# Patient Record
Sex: Female | Born: 1975
Health system: Southern US, Community
[De-identification: ages and names within clinical notes are randomized; demographics above are authoritative.]

## PROBLEM LIST (undated history)

## (undated) DIAGNOSIS — F329 Major depressive disorder, single episode, unspecified: Secondary | ICD-10-CM

## (undated) DIAGNOSIS — F32A Depression, unspecified: Secondary | ICD-10-CM

---

## 2003-11-17 ENCOUNTER — Emergency Department: Payer: Self-pay | Admitting: Emergency Medicine

## 2006-03-17 ENCOUNTER — Ambulatory Visit: Payer: Self-pay | Admitting: Family Medicine

## 2006-12-12 ENCOUNTER — Ambulatory Visit: Payer: Self-pay | Admitting: Obstetrics and Gynecology

## 2007-01-26 ENCOUNTER — Ambulatory Visit: Payer: Self-pay | Admitting: Obstetrics and Gynecology

## 2007-04-03 ENCOUNTER — Ambulatory Visit: Payer: Self-pay | Admitting: Obstetrics and Gynecology

## 2007-06-02 ENCOUNTER — Inpatient Hospital Stay: Payer: Self-pay | Admitting: Obstetrics and Gynecology

## 2008-10-25 ENCOUNTER — Emergency Department: Payer: Self-pay | Admitting: Emergency Medicine

## 2009-07-16 IMAGING — US US OB US >=[ID] SNGL FETUS
1 series · 17 of 28 positions shown · non-contrast
Comparison: none

REASON FOR EXAM: LLP, EFW, AFI, anatomy
COMMENTS:

[Series 1: us ob us >=(id) sngl fetus · 17 of 80 slices shown]
[im 1/80]
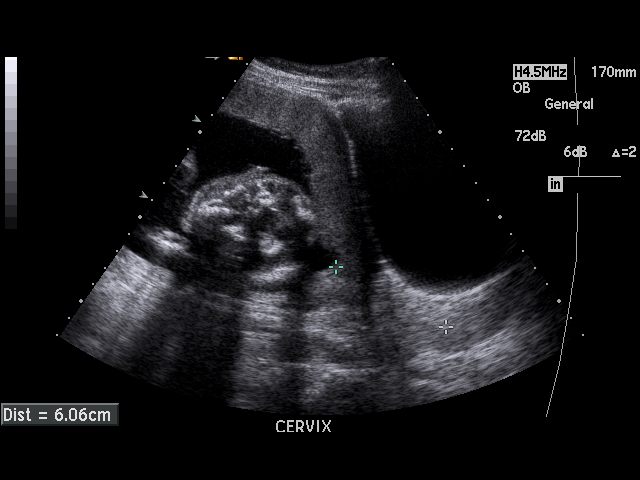
[im 6/80]
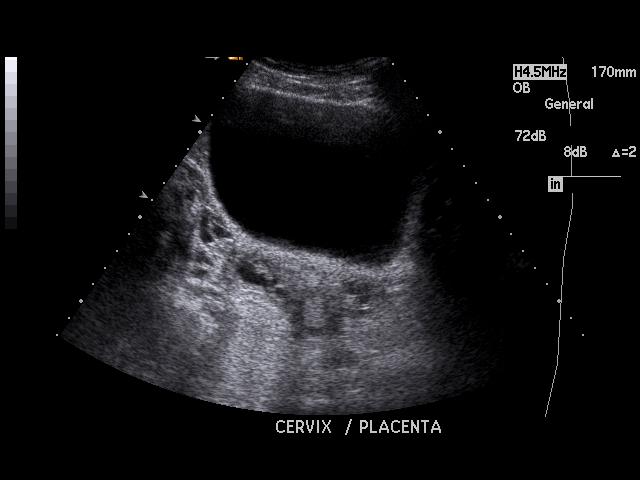
[im 12/80]
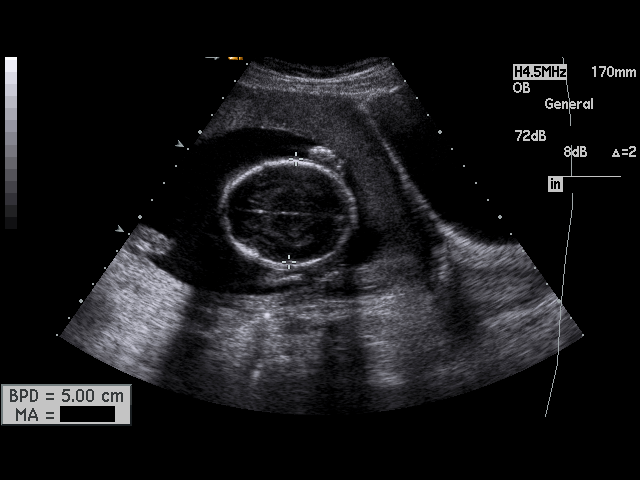
[im 15/80]
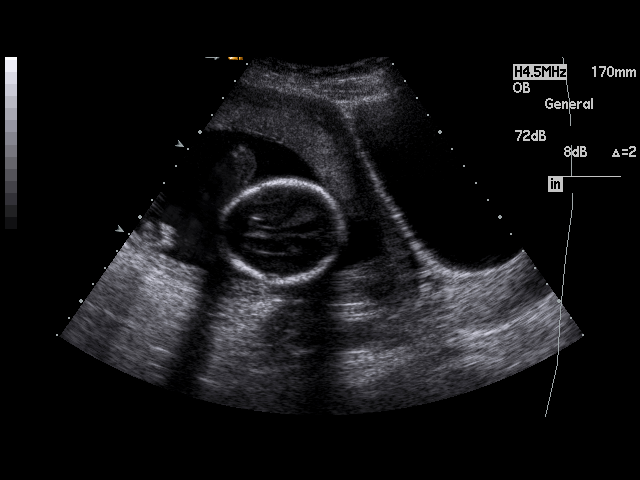
[im 21/80]
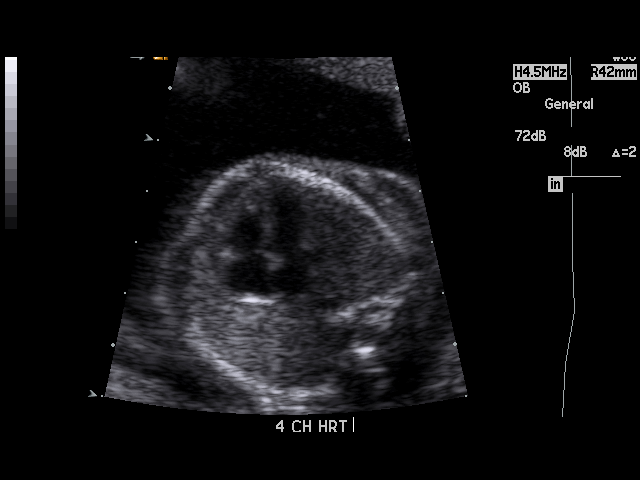
[im 27/80]
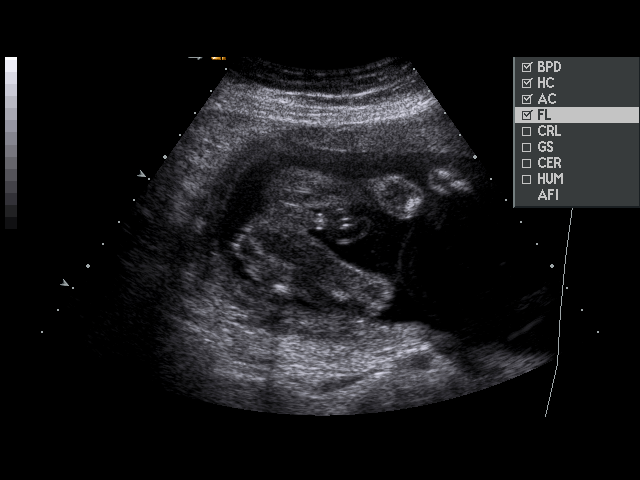
[im 30/80]
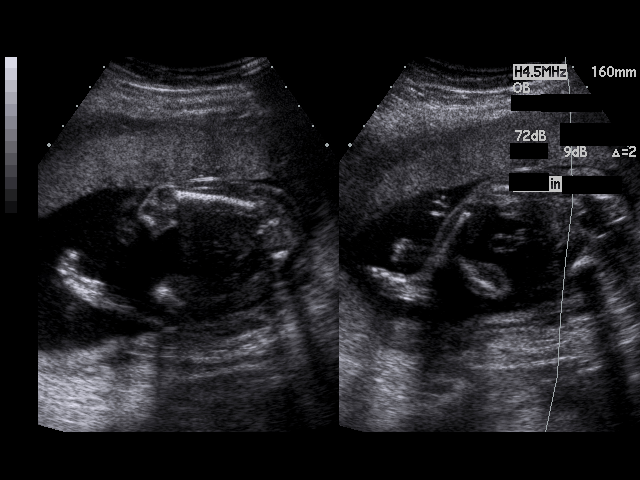
[im 36/80]
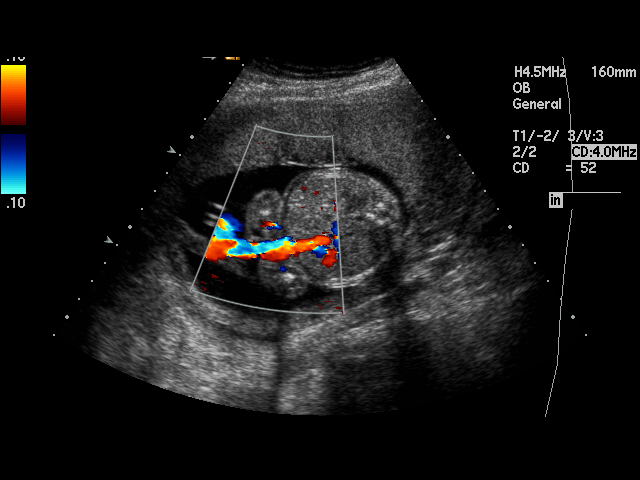
[im 41/80]
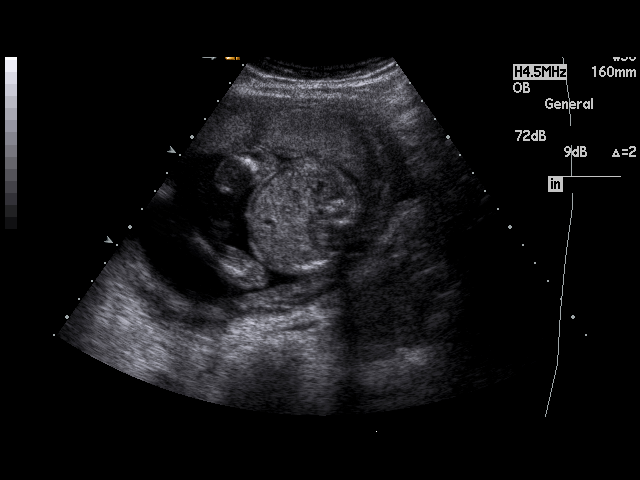
[im 44/80]
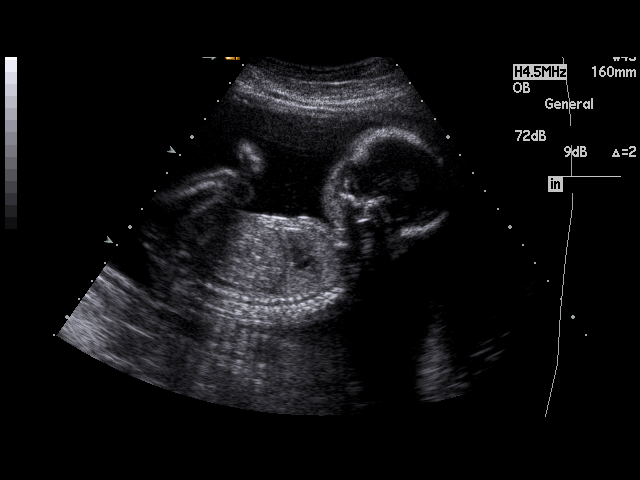
[im 50/80]
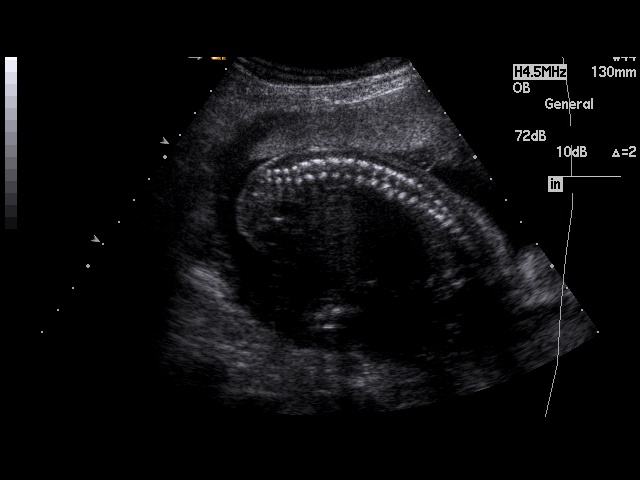
[im 53/80]
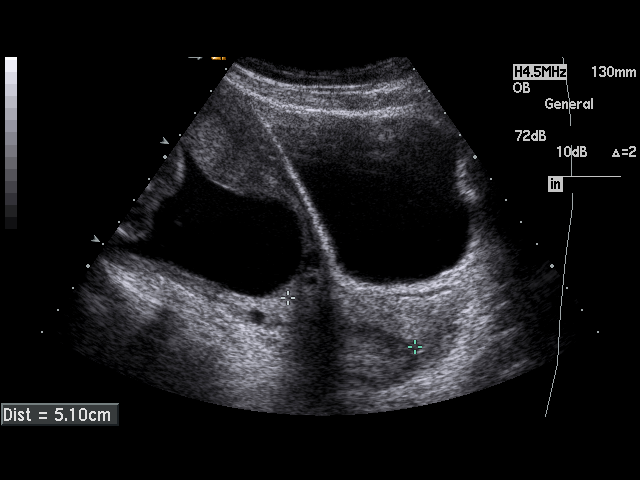
[im 59/80]
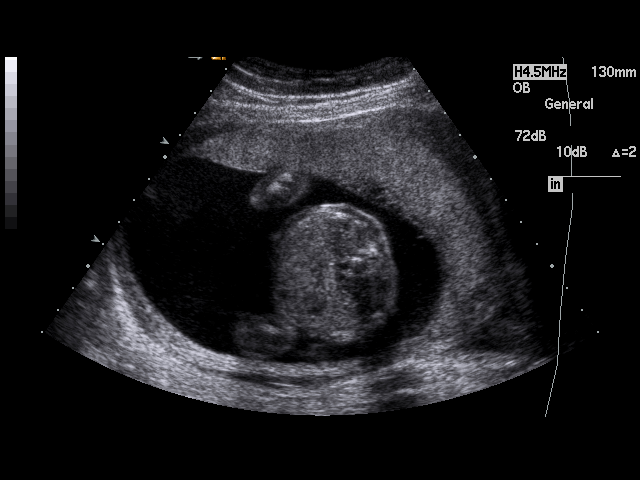
[im 65/80]
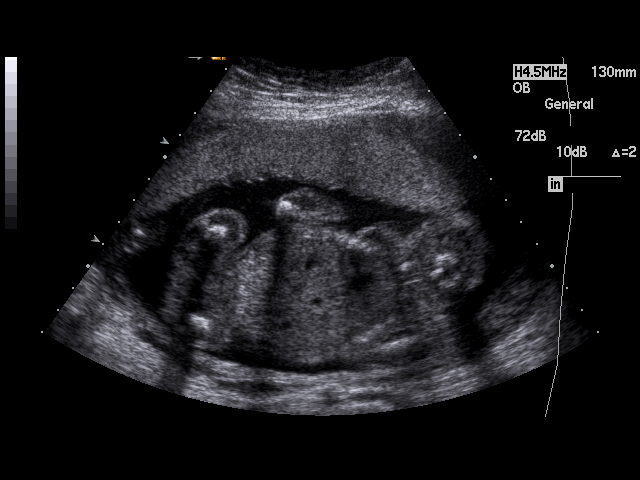
[im 68/80]
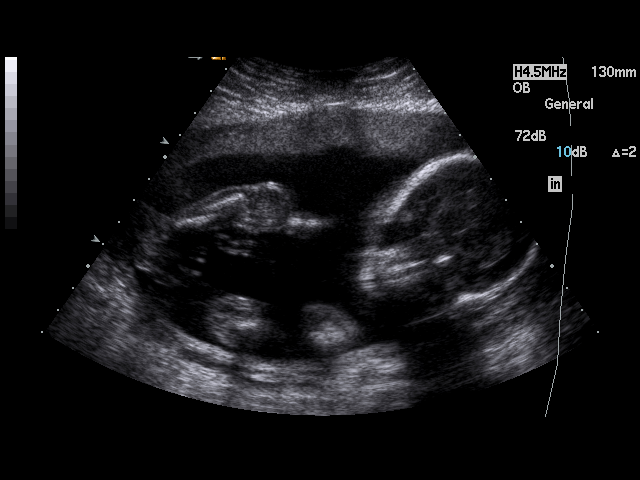
[im 74/80]
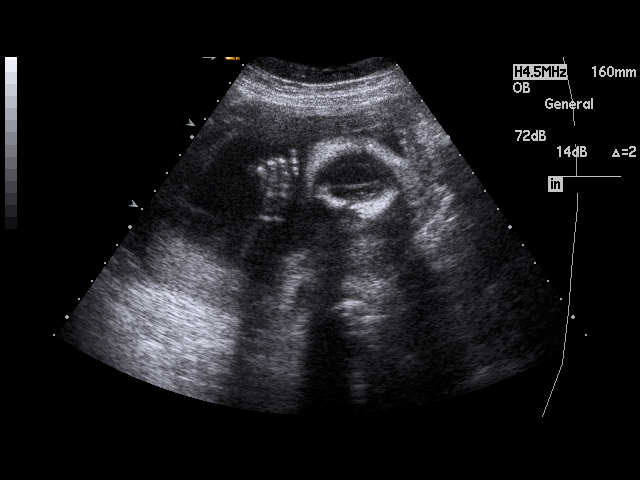
[im 80/80]
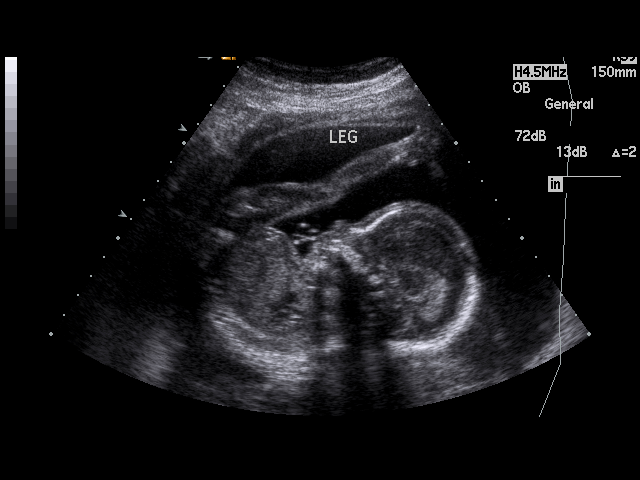

[17 of 28 positions shown; findings below may reference images not displayed]

PROCEDURE:     US  - US OB GREATER/OR EQUAL TO OVDPQ  - January 26, 2007 [DATE]

RESULT:     There is a viable IUP with a variable presentation. The placenta
is anterior and partially low lying. The amniotic fluid volume is estimated
to be normal. Fetal cardiac activity with a rate of 144 beats per minute was
demonstrated. The intracranial structures, craniocervical junction and the
spinal structures are normal in appearance. A four-chambered heart is
demonstrated. The fetal stomach, kidneys and urinary bladder are normal in
appearance. The amniotic fluid index is 14.37 cm which is normal.

Measured parameters:

     BPD:  5.0 cm, corresponding to EGA of 21 weeks, 1 day
     HC:   18.9 cm, corresponding to EGA of 87weeks, 2 days
     AC:   16.2 cm, corresponding to EGA of 21 weeks, 2 days
     FL:      3.7 cm, corresponding to EGA of 21 weeks, 5 days

The estimated fetal weight is 446 grams plus or minus 60 grams.
IMPRESSION: There is a viable IUP with estimated gestational age of 21
weeks, 2 days plus or minus approximately 12 days. The estimated date of
confinement is 06/06/2007. This is in reasonable agreement with the
patient's clinical dating. No fetal anomalies are identified. The amniotic
fluid index is normal. The estimated fetal weight is 446 grams plus or minus
60 grams.

## 2009-08-31 ENCOUNTER — Ambulatory Visit: Payer: Self-pay | Admitting: Family Medicine

## 2010-02-11 ENCOUNTER — Ambulatory Visit: Payer: Self-pay | Admitting: Family Medicine

## 2011-01-12 HISTORY — PX: TUBAL LIGATION: SHX77

## 2011-10-18 ENCOUNTER — Ambulatory Visit: Payer: Self-pay

## 2011-11-11 ENCOUNTER — Inpatient Hospital Stay: Payer: Self-pay | Admitting: Obstetrics & Gynecology

## 2011-11-11 LAB — PIH PROFILE
Anion Gap: 14 (ref 7–16)
BUN: 6 mg/dL — ABNORMAL LOW (ref 7–18)
Calcium, Total: 9.3 mg/dL (ref 8.5–10.1)
Chloride: 112 mmol/L — ABNORMAL HIGH (ref 98–107)
EGFR (Non-African Amer.): >60
Glucose: 91 mg/dL (ref 65–99)
HCT: 32.9 % — ABNORMAL LOW (ref 35.0–47.0)
MCH: 30.4 pg (ref 26.0–34.0)
MCV: 90 fL (ref 80–100)
Osmolality: 284 (ref 275–301)
Platelet: 236 10*3/uL (ref 150–440)
Potassium: 3.9 mmol/L (ref 3.5–5.1)
RBC: 3.64 10*6/uL — ABNORMAL LOW (ref 3.80–5.20)
RDW: 14.3 % (ref 11.5–14.5)
SGOT(AST): 14 U/L — ABNORMAL LOW (ref 15–37)
Uric Acid: 6.4 mg/dL — ABNORMAL HIGH (ref 2.6–6.0)
WBC: 10.4 10*3/uL (ref 3.6–11.0)

## 2011-11-11 LAB — PROTEIN / CREATININE RATIO, URINE: Protein, Random Urine: 25 mg/dL — ABNORMAL HIGH (ref 0–12)

## 2011-11-12 ENCOUNTER — Ambulatory Visit: Payer: Self-pay

## 2011-11-12 LAB — HEMATOCRIT: HCT: 29.6 % — ABNORMAL LOW (ref 35.0–47.0)

## 2011-11-15 LAB — PATHOLOGY REPORT

## 2011-12-12 ENCOUNTER — Ambulatory Visit: Payer: Self-pay

## 2013-07-08 ENCOUNTER — Ambulatory Visit: Payer: Self-pay | Admitting: Physician Assistant

## 2013-11-13 ENCOUNTER — Ambulatory Visit: Payer: Self-pay | Admitting: Family Medicine

## 2013-11-13 LAB — RAPID STREP-A WITH REFLX: MICRO TEXT REPORT: NEGATIVE

## 2013-11-13 LAB — RAPID INFLUENZA A&B ANTIGENS (ARMC ONLY)

## 2013-11-16 LAB — BETA STREP CULTURE(ARMC)

## 2013-12-12 ENCOUNTER — Ambulatory Visit: Payer: Self-pay | Admitting: Physician Assistant

## 2014-04-30 NOTE — Op Note (Signed)
PATIENT NAME:  Cynthia SessionsLANGLEY, Cynthia V MR#:  [id number] DATE OF BIRTH:  02-07-1975  DATE OF PROCEDURE:  11/12/2011  PREOPERATIVE DIAGNOSIS: Desires sterility.   POSTOPERATIVE DIAGNOSIS:  Desires sterility.  PROCEDURE: Bilateral tubal ligation, modified [place] method.   SURGEON: Senaida LangeLashawn Weaver-Lee, M.D.   ANESTHESIA: General.   ESTIMATED BLOOD LOSS: Minimal.   COMPLICATIONS: None.   FINDINGS: Normal appearing tubes.   SPECIMEN: Portions of right and left tubes.   INDICATIONS: The patient is a 39 year old who is status post spontaneous vaginal delivery one day ago who desires permanent sterility. Risks, benefits, indications, and alternatives of the procedure and of blood transfusion were explained.  PROCEDURE: The patient was taken to the operating room with IV fluids running. She was prepped and draped in the usual sterile fashion in the supine position. The infraumbilical region was injected with 0.5% Sensorcaine, 10 mL. A horizontal infraumbilical incision was made and carried down to the underlying subcutaneous tissue. The tissue was dissected using a hemostat. The fascia was grasped with Kocher clamps and entered sharply. The opening was digitally extended. The peritoneum was entered bluntly. The left tube was identified after bowels were packed back with moist lap sponge. The tube was grasped and delivered through the incision. An avascular window in the mesosalpinx was identified and an opening made in it with the Bovie cautery. Two pieces of plain gut suture were passed through the opening and the tube was tied 2 to 3 cm lateral to the uterine cornu. The tube was cut. The cut edges were made hemostatic and the tube was returned to the abdomen. This was repeated on the patient's right tube. The previously placed #0 Vicryl on the fascia was used to reapproximate and repair the fascia. The skin was closed with #4-0 Vicryl in a subcuticular fashion. The patient tolerated the procedure well.  Sponge, needle, and instrument counts were correct times two. The patient was awakened from anesthesia and taken to the recovery room in stable condition.    ____________________________ Sonda PrimesLashawn A. Patton SallesWeaver-Lee, MD law:bjt D: 11/12/2011 12:21:34 ET T: 11/12/2011 12:32:31 ET JOB#: [id number]  cc: Flint MelterLashawn A. Patton SallesWeaver-Lee, MD, <Dictator> Sonda PrimesLASHAWN A WEAVER LEE MD ELECTRONICALLY SIGNED 11/12/2011 13:17

## 2014-05-21 NOTE — H&P (Signed)
L&D Evaluation:  History Expanded:   HPI [age] [facility] at [facility] who presents with SROM clear fluid. she has not been checked yet for GBS so she will be treated as unknown. no records are available at htis time to check anything else on the patient. will request in the AM from the office so peds has the Hep status.    Gravida 2    Term 1    PreTerm 0    Abortion 0    Living 1    Blood Type (Maternal) unknown    Group B Strep Results Maternal (Result >5wks must be treated as unknown) unknown/result > 5 weeks ago    Maternal HIV Unknown    Maternal Syphilis Ab Unknown    Maternal Varicella Unknown    Rubella Results (Maternal) unknown    Maternal T-Dap Unknown    [facility]    Presents with SROM    Patient's Medical History No Chronic Illness    Patient's Surgical History none    Medications Pre Natal Vitamins    Allergies NKDA    Social History none    Family History Non-Contributory    Current Prenatal Course Notable For gestational diabetic diet controlled glucoses habve been well per pt    ROS:   ROS All systems were reviewed.  HEENT, CNS, GI, GU, Respiratory, CV, Renal and Musculoskeletal systems were found to be normal.   Exam:   Vital Signs stable    Urine Protein not completed    General no apparent distress    Mental Status clear    Chest clear    Heart normal sinus rhythm    Abdomen gravid, non-tender    Edema no edema    Pelvic no external lesions    Mebranes Ruptured    Description clear    FHT normal rate with no decels, strictly reactive    Ucx absent    Skin dry   Impression:   Impression other, Srom AT [redacted]W[redacted]D   Plan:   Plan UA, PIH panel    Comments WILL CHECK PIGH ;ABS GIVEN INCREASED bp AND start gbs prophylaxis, if not contracting after second dose of amp will start pitocin.    Follow Up Appointment need to schedule. in 6 weeks   Electronic Signatures: Adria DevonKlett, Adalia Pettis (MD)  (Signed 31-Oct-13 01:14)  Authored:  L&D Evaluation   Last Updated: 31-Oct-13 01:14 by Adria DevonKlett, Suzzane Quilter (MD)

## 2014-07-28 ENCOUNTER — Ambulatory Visit
Admission: EM | Admit: 2014-07-28 | Discharge: 2014-07-28 | Disposition: A | Discharge status: Home / Self Care | Payer: Federal, State, Local not specified - PPO | Attending: Internal Medicine | Admitting: Internal Medicine

## 2014-07-28 DIAGNOSIS — H6091 Unspecified otitis externa, right ear: Secondary | ICD-10-CM | POA: Diagnosis not present

## 2014-07-28 DIAGNOSIS — H6191 Disorder of right external ear, unspecified: Secondary | ICD-10-CM

## 2014-07-28 HISTORY — DX: Major depressive disorder, single episode, unspecified: F32.9

## 2014-07-28 HISTORY — DX: Depression, unspecified: F32.A

## 2014-07-28 MED ORDER — NEOMYCIN-POLYMYXIN-HC 3.5-10000-1 OT SUSP: 4.0000 [drp] | Freq: Three times a day (TID) | OTIC | Status: DC

## 2014-07-28 NOTE — Discharge Instructions (Signed)
Prescription for ear drops sent to the [facility]. Followup with ENT in a week, to check raw patch in R ear canal.

## 2014-07-28 NOTE — ED Notes (Signed)
Patient with right ear drainage that started about two weeks ago. Having some pain.

## 2014-07-28 NOTE — ED Provider Notes (Signed)
CSN: [id number]     Arrival date & time 07/28/14  1129 History   First MD Initiated Contact with Patient 07/28/14 1248     Chief Complaint  Patient presents with  . Ear Drainage  . Otalgia   HPI Patient is a 39 year old lady who reports a 2 to three-week history of scant discharge from the right ear. This was reddish initially, now is brownish, with some odor. There is mild ear discomfort. No loss of hearing, no tinnitus. Her balance is okay. No runny/congested nose, no cough, no sore throat. No headache. No fever. The ear was not itchy prior to discharge onset. She has no prior history of ear drainage.  Past Medical History  Diagnosis Date  . Depressed    History reviewed. No pertinent past surgical history. History reviewed. No pertinent family history. History  Substance Use Topics  . Smoking status: Former Games developermoker  . Smokeless tobacco: Never Used  . Alcohol Use: Yes     Comment: occasional    Review of Systems  All other systems reviewed and are negative.   Allergies  Review of patient's allergies indicates no known allergies.  Home Medications   Prior to Admission medications   Medication Sig Start Date End Date Taking? Authorizing Provider  ALPRAZolam Prudy Feeler(XANAX) 1 MG tablet Take 1 mg by mouth at bedtime as needed for anxiety.   Yes Historical Provider, MD  busPIRone (BUSPAR) 10 MG tablet Take 10 mg by mouth 3 (three) times daily.   Yes Historical Provider, MD  clonazePAM (KLONOPIN) 1 MG tablet Take 1 mg by mouth 2 (two) times daily.   Yes Historical Provider, MD  sertraline (ZOLOFT) 25 MG tablet Take 25 mg by mouth daily.   Yes Historical Provider, MD   BP 125/87 mmHg  Pulse 97  Temp(Src) 97.5 F (36.4 C) (Tympanic)  Resp 20  Ht 5\' 4"  (1.626 m)  Wt 185 lb (83.915 kg)  BMI 31.74 kg/m2  SpO2 95%  LMP 07/01/2014 Physical Exam  Constitutional: She is oriented to person, place, and time. No distress.  Alert, nicely groomed  HENT:  Head: Atraumatic.  Left TM is  moderately occluded with wax Right TM has minimal wax, but some purulent debris adherent to a friable looking swelling in the proximal anterior part of the ear canal  Eyes:  Conjugate gaze, no eye redness/drainage  Neck: Neck supple.  Cardiovascular: Normal rate.   Pulmonary/Chest: No respiratory distress.  Abdominal: She exhibits no distension.  Musculoskeletal: Normal range of motion.  No leg swelling  Neurological: She is alert and oriented to person, place, and time.  Skin: Skin is warm and dry.  No cyanosis  Nursing note and vitals reviewed.   ED Course  Procedures  Ears irrigated by nurse, with removal of wax on the left, and debris on the right.  MDM   1. Lesion of external ear canal, right   2. Right otitis externa    Prescription for Cortisporin otic sent to the pharmacy; follow-up with Dr. Cyd SilenceJuengel/ENT in a week, to recheck the raw patch in the right ear canal.    Eustace MooreLaura W Brixton Franko, MD 07/28/14 [phone]

## 2015-02-26 ENCOUNTER — Ambulatory Visit (INDEPENDENT_AMBULATORY_CARE_PROVIDER_SITE_OTHER): Payer: Federal, State, Local not specified - PPO | Admitting: Obstetrics and Gynecology

## 2015-02-26 ENCOUNTER — Other Ambulatory Visit: Payer: Self-pay | Admitting: Obstetrics and Gynecology

## 2015-02-26 ENCOUNTER — Encounter: Payer: Self-pay | Admitting: Obstetrics and Gynecology

## 2015-02-26 VITALS — BP 116/86 | HR 129 | Ht 64.0 in | Wt 203.1 lb

## 2015-02-26 DIAGNOSIS — M79671 Pain in right foot: Secondary | ICD-10-CM | POA: Diagnosis not present

## 2015-02-26 DIAGNOSIS — Z01419 Encounter for gynecological examination (general) (routine) without abnormal findings: Secondary | ICD-10-CM

## 2015-02-26 DIAGNOSIS — Z8742 Personal history of other diseases of the female genital tract: Secondary | ICD-10-CM | POA: Diagnosis not present

## 2015-02-26 DIAGNOSIS — N393 Stress incontinence (female) (male): Secondary | ICD-10-CM

## 2015-02-26 NOTE — Patient Instructions (Signed)
  Place annual gynecologic exam patient instructions here.  Thank you for enrolling in MyChart. Please follow the instructions below to securely access your online medical record. MyChart allows you to send messages to your doctor, view your test results, manage appointments, and more.   How Do I Sign Up? 1. In your Internet browser, go to [employer] and enter [email or web address]. 2. Click on the Sign Up Now link in the Sign In box. You will see the New Member Sign Up page. 3. Enter your MyChart Access Code exactly as it appears below. You will not need to use this code after you've completed the sign-up process. If you do not sign up before the expiration date, you must request a new code.  MyChart Access Code: [email or web address] Expires: 03/30/2015 10:06 AM  4. Enter your Social Security Number ([id number]) and Date of Birth (mm/dd/yyyy) as indicated and click Submit. You will be taken to the next sign-up page. 5. Create a MyChart ID. This will be your MyChart login ID and cannot be changed, so think of one that is secure and easy to remember. 6. Create a MyChart password. You can change your password at any time. 7. Enter your Password Reset Question and Answer. This can be used at a later time if you forget your password.  8. Enter your e-mail address. You will receive e-mail notification when new information is available in MyChart. 9. Click Sign Up. You can now view your medical record.   Additional Information Remember, MyChart is NOT to be used for urgent needs. For medical emergencies, dial 911.

## 2015-02-26 NOTE — Progress Notes (Signed)
Subjective:   Cynthia Keller is a 40 y.o. G2P0 Caucasian female here for a routine well-woman exam.  Patient's last menstrual period was 02/19/2015 (exact date).    Current complaints: depression and anxiety, leaking urine when coughing, frustration over weight PCP: none       Does need & desire labs  Social History: Sexual: heterosexual Marital Status: married Living situation: with family Occupation: homemaker Tobacco/alcohol: no tobacco use Illicit drugs: no history of illicit drug use  The following portions of the patient's history were reviewed and updated as appropriate: allergies, current medications, past family history, past medical history, past social history, past surgical history and problem list.  Past Medical History Past Medical History  Diagnosis Date  . Depressed     Past Surgical History Past Surgical History  Procedure Laterality Date  . Tubal ligation  2013    Gynecologic History G2P0  Patient's last menstrual period was 02/19/2015 (exact date). Contraception: tubal ligation Last Pap: 2014. Results were: normal  Obstetric History OB History  Gravida Para Term Preterm AB SAB TAB Ectopic Multiple Living  2         2    # Outcome Date GA Lbr Len/2nd Weight Sex Delivery Anes PTL Lv  2 Gravida 2013    F Vag-Spont   Y  1 Gravida 2009    M Vag-Spont   Y      Current Medications Current Outpatient Prescriptions on File Prior to Visit  Medication Sig Dispense Refill  . ALPRAZolam (XANAX) 1 MG tablet Take 1 mg by mouth at bedtime as needed for anxiety.    . busPIRone (BUSPAR) 10 MG tablet Take 10 mg by mouth 3 (three) times daily.    . clonazePAM (KLONOPIN) 1 MG tablet Take 1 mg by mouth 2 (two) times daily.    Marland Kitchen neomycin-polymyxin-hydrocortisone (CORTISPORIN) 3.5-10000-1 otic suspension Place 4 drops into the right ear 3 (three) times daily. 10 mL 0  . sertraline (ZOLOFT) 25 MG tablet Take 100 mg by mouth daily.      No current  facility-administered medications on file prior to visit.    Review of Systems Patient denies any headaches, blurred vision, shortness of breath, chest pain, abdominal pain, problems with bowel movements, urination, or intercourse.  Objective:  BP 116/86 mmHg  Pulse 129  Ht  (1.626 m)  Wt 203 lb 1.6 oz (92.126 kg)  BMI 34.85 kg/m2  LMP 02/19/2015 (Exact Date) Physical Exam  General:  Well developed, well nourished, no acute distress. She is alert and oriented x3. Skin:  Warm and dry Neck:  Midline trachea, no thyromegaly or nodules Cardiovascular: Regular rate and rhythm, no murmur heard Lungs:  Effort normal, all lung fields clear to auscultation bilaterally Breasts:  No dominant palpable mass, retraction, or nipple discharge Abdomen:  Soft, non tender, no hepatosplenomegaly or masses Pelvic:  External genitalia is normal in appearance.  The vagina is normal in appearance. The cervix is bulbous, no CMT.  Thin prep pap is done with HR HPV cotesting. Uterus is felt to be normal size, shape, and contour.  No adnexal masses or tenderness noted. Extremities:  No swelling or varicosities noted Psych:  She has a normal mood and affect  Assessment:   Healthy well-woman exam Obesity Right heel pain x 2 months Depression with anxiety Stress incontenance  Plan:  Pap ands labs obtained F/U 1 year  for AE, or sooner if needed Referred to [facility] Recommend increasing exercise and losing weight  Anouk Critzer Rockney Ghee, CNM

## 2015-02-27 LAB — THYROID PANEL WITH TSH
Free Thyroxine Index: 1.4 (ref 1.2–4.9)
T3 UPTAKE RATIO: 28 % (ref 24–39)
T4, Total: 5 ug/dL (ref 4.5–12.0)
TSH: 1.28 u[IU]/mL (ref 0.450–4.500)

## 2015-02-27 LAB — COMPREHENSIVE METABOLIC PANEL
A/G RATIO: 1.6 (ref 1.1–2.5)
ALBUMIN: 4.5 g/dL (ref 3.5–5.5)
ALK PHOS: 83 IU/L (ref 39–117)
ALT: 37 IU/L — ABNORMAL HIGH (ref 0–32)
AST: 27 IU/L (ref 0–40)
BILIRUBIN TOTAL: 0.8 mg/dL (ref 0.0–1.2)
BUN / CREAT RATIO: 13 (ref 8–20)
BUN: 12 mg/dL (ref 6–20)
CHLORIDE: 104 mmol/L (ref 96–106)
CO2: 17 mmol/L — ABNORMAL LOW (ref 18–29)
Calcium: 9.8 mg/dL (ref 8.7–10.2)
Creatinine, Ser: 0.91 mg/dL (ref 0.57–1.00)
GFR calc non Af Amer: 80 mL/min/{1.73_m2} (ref >59)
GFR, EST AFRICAN AMERICAN: 93 mL/min/{1.73_m2} (ref >59)
Globulin, Total: 2.8 g/dL (ref 1.5–4.5)
Glucose: 101 mg/dL — ABNORMAL HIGH (ref 65–99)
Potassium: 4 mmol/L (ref 3.5–5.2)
Sodium: 143 mmol/L (ref 134–144)
TOTAL PROTEIN: 7.3 g/dL (ref 6.0–8.5)

## 2015-02-27 LAB — HEMOGLOBIN A1C
Est. average glucose Bld gHb Est-mCnc: 114 mg/dL
HEMOGLOBIN A1C: 5.6 % (ref 4.8–5.6)

## 2015-02-27 LAB — LIPID PANEL
Chol/HDL Ratio: 4.8 ratio units — ABNORMAL HIGH (ref 0.0–4.4)
Cholesterol, Total: 205 mg/dL — ABNORMAL HIGH (ref 100–199)
HDL: 43 mg/dL (ref >39)
TRIGLYCERIDES: 419 mg/dL — AB (ref 0–149)

## 2015-02-28 LAB — CYTOLOGY - PAP

## 2015-03-04 ENCOUNTER — Other Ambulatory Visit: Payer: Self-pay | Admitting: Obstetrics and Gynecology

## 2015-03-04 DIAGNOSIS — E782 Mixed hyperlipidemia: Secondary | ICD-10-CM | POA: Insufficient documentation

## 2015-03-05 ENCOUNTER — Telehealth: Payer: Self-pay | Admitting: *Deleted

## 2015-03-05 NOTE — Telephone Encounter (Signed)
Mailed pt all info about her lab results

## 2015-03-05 NOTE — Telephone Encounter (Signed)
-----   Message from Purcell N[place], [place] sent at 03/04/2015  3:21 PM EST ----- Please let her know all labs were normal except cholesterol and triglycerides- needs to work hard on  Lowering chol. In diet and increasing exercise and weight loss. Will recheck in 1 year

## 2016-02-21 DIAGNOSIS — F410 Panic disorder [episodic paroxysmal anxiety] without agoraphobia: Secondary | ICD-10-CM | POA: Diagnosis not present

## 2016-02-21 DIAGNOSIS — F3342 Major depressive disorder, recurrent, in full remission: Secondary | ICD-10-CM | POA: Diagnosis not present

## 2016-02-24 DIAGNOSIS — K080 Exfoliation of teeth due to systemic causes: Secondary | ICD-10-CM | POA: Diagnosis not present

## 2016-06-01 IMAGING — CR RIGHT TIBIA AND FIBULA - 2 VIEW
2 series · 2 of 2 positions shown · non-contrast
Comparison: None.

CLINICAL DATA: Injured playing football 2 weeks ago with pain

EXAM:
RIGHT TIBIA AND FIBULA - 2 VIEW

[tibia ap]
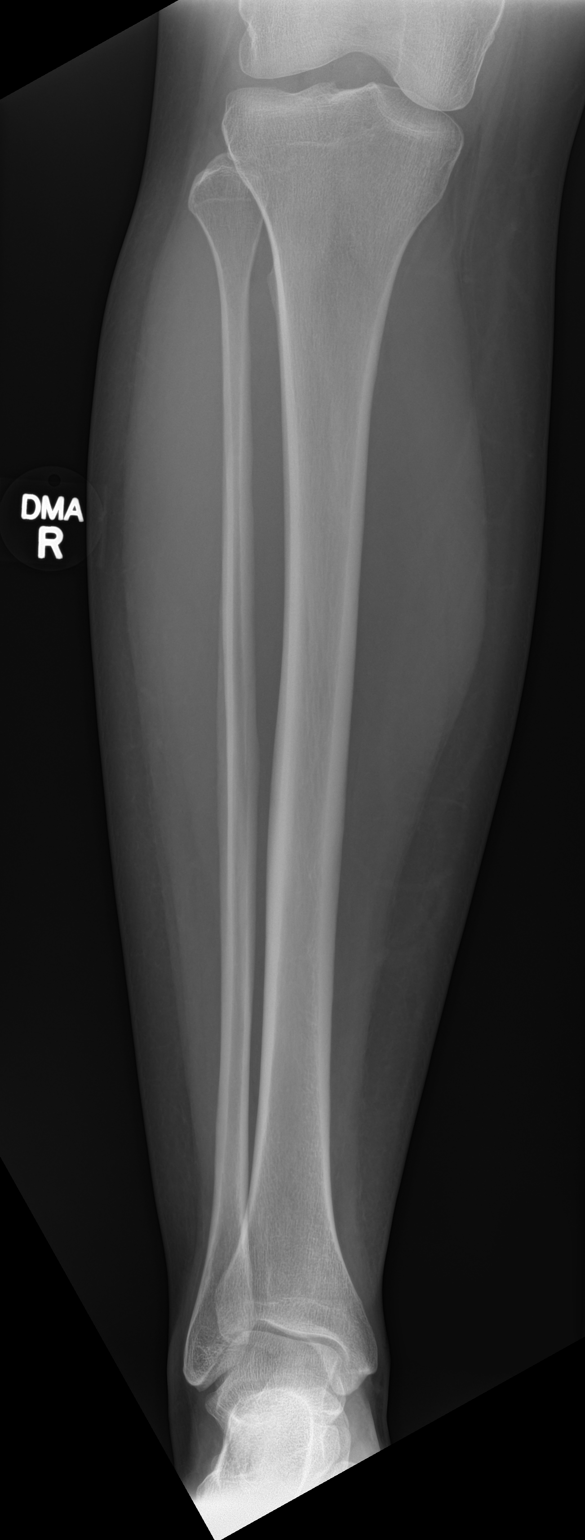

[tibia lat]
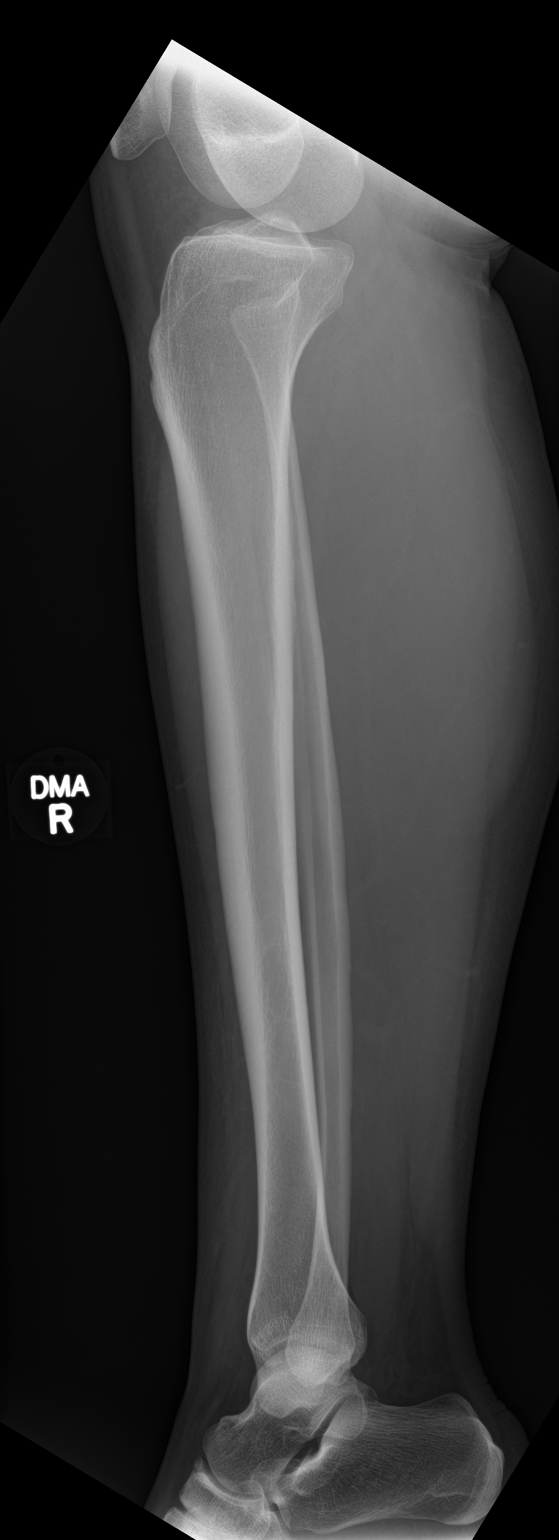

[2 of 2 positions shown; findings below may reference images not displayed]

FINDINGS: The tibia and fibula are normally aligned. No fracture is seen. No
periosteal reaction is noted.
IMPRESSION: Negative.

## 2016-08-21 DIAGNOSIS — F410 Panic disorder [episodic paroxysmal anxiety] without agoraphobia: Secondary | ICD-10-CM | POA: Diagnosis not present

## 2016-08-21 DIAGNOSIS — F3342 Major depressive disorder, recurrent, in full remission: Secondary | ICD-10-CM | POA: Diagnosis not present

## 2016-09-14 DIAGNOSIS — K080 Exfoliation of teeth due to systemic causes: Secondary | ICD-10-CM | POA: Diagnosis not present

## 2017-02-12 DIAGNOSIS — F3342 Major depressive disorder, recurrent, in full remission: Secondary | ICD-10-CM | POA: Diagnosis not present

## 2017-02-12 DIAGNOSIS — F410 Panic disorder [episodic paroxysmal anxiety] without agoraphobia: Secondary | ICD-10-CM | POA: Diagnosis not present

## 2017-04-19 DIAGNOSIS — K080 Exfoliation of teeth due to systemic causes: Secondary | ICD-10-CM | POA: Diagnosis not present

## 2017-08-13 DIAGNOSIS — F410 Panic disorder [episodic paroxysmal anxiety] without agoraphobia: Secondary | ICD-10-CM | POA: Diagnosis not present

## 2017-08-13 DIAGNOSIS — F3342 Major depressive disorder, recurrent, in full remission: Secondary | ICD-10-CM | POA: Diagnosis not present

## 2017-10-25 DIAGNOSIS — K080 Exfoliation of teeth due to systemic causes: Secondary | ICD-10-CM | POA: Diagnosis not present

## 2017-11-28 DIAGNOSIS — Z0000 Encounter for general adult medical examination without abnormal findings: Secondary | ICD-10-CM | POA: Diagnosis not present

## 2017-11-28 DIAGNOSIS — Z23 Encounter for immunization: Secondary | ICD-10-CM | POA: Diagnosis not present

## 2017-11-28 DIAGNOSIS — Z01419 Encounter for gynecological examination (general) (routine) without abnormal findings: Secondary | ICD-10-CM | POA: Diagnosis not present

## 2017-11-28 DIAGNOSIS — Z7689 Persons encountering health services in other specified circumstances: Secondary | ICD-10-CM | POA: Diagnosis not present

## 2017-11-28 DIAGNOSIS — Z6828 Body mass index (BMI) 28.0-28.9, adult: Secondary | ICD-10-CM | POA: Diagnosis not present

## 2017-11-28 DIAGNOSIS — Z1151 Encounter for screening for human papillomavirus (HPV): Secondary | ICD-10-CM | POA: Diagnosis not present

## 2017-12-30 ENCOUNTER — Ambulatory Visit
Admission: EM | Admit: 2017-12-30 | Discharge: 2017-12-30 | Disposition: A | Discharge status: Home / Self Care | Payer: Federal, State, Local not specified - PPO | Attending: Family Medicine | Admitting: Family Medicine

## 2017-12-30 ENCOUNTER — Other Ambulatory Visit: Payer: Self-pay

## 2017-12-30 DIAGNOSIS — H6123 Impacted cerumen, bilateral: Secondary | ICD-10-CM | POA: Diagnosis not present

## 2017-12-30 NOTE — ED Triage Notes (Addendum)
Patient complains of right ear pain that is sometimes painful but she has been unable to hear out of it. States that this started 1 week ago.

## 2017-12-30 NOTE — ED Provider Notes (Signed)
[facility]    CSN: [id number] Arrival date & time: 12/30/17  1644     History   Chief Complaint Chief Complaint  Patient presents with  . Otalgia    right    HPI Cynthia Keller is a 42 y.o. female.   HPI  Female presents with right ear hearing loss started about a week or 2 ago.  Does not complain of pain.  No discharge denies dizziness nausea.  She has had no tinnitus.  It is just is difficult to hear.  She had this 1 other time remarked about how good it felt after the ear was flushed       Past Medical History:  Diagnosis Date  . Depressed     Patient Active Problem List   Diagnosis Date Noted  . Elevated cholesterol with elevated triglycerides 03/04/2015  . H/O abnormal cervical Papanicolaou smear 02/26/2015  . Obesities, morbid (HCC) 02/26/2015    Past Surgical History:  Procedure Laterality Date  . TUBAL LIGATION  2013    OB History    Gravida  2   Para      Term      Preterm      AB      Living  2     SAB      TAB      Ectopic      Multiple      Live Births  2            Home Medications    Prior to Admission medications   Medication Sig Start Date End Date Taking? Authorizing Provider  clonazePAM (KLONOPIN) 1 MG tablet Take 1 mg by mouth 2 (two) times daily.   Yes [provider]  Vilazodone HCl (VIIBRYD) 40 MG TABS  10/22/17  Yes [provider]    Family History Family History  Problem Relation Age of Onset  . Cancer Maternal Grandfather     Social History Social History   Tobacco Use  . Smoking status: Former Games developermoker  . Smokeless tobacco: Never Used  Substance Use Topics  . Alcohol use: Yes    Comment: occasional  . Drug use: No     Allergies   Patient has no known allergies.   Review of Systems Review of Systems  Constitutional: Negative for activity change, appetite change, chills, diaphoresis, fatigue and fever.  HENT: Positive for hearing loss. Negative for  ear discharge, ear pain and tinnitus.      Physical Exam Triage Vital Signs ED Triage Vitals  Enc Vitals Group     BP 12/30/17 1656 121/89     Pulse Rate 12/30/17 1656 76     Resp 12/30/17 1656 18     Temp 12/30/17 1656 98.2 F (36.8 C)     Temp Source 12/30/17 1656 Oral     SpO2 12/30/17 1656 100 %     Weight 12/30/17 1654 160 lb (72.6 kg)     Height 12/30/17 1654 5\' 4"  (1.626 m)     Head Circumference --      Peak Flow --      Pain Score 12/30/17 1654 1     Pain Loc --      Pain Edu? --      Excl. in GC? --    No data found.  Updated Vital Signs BP 121/89 (BP Location: Right Arm)   Pulse 76   Temp 98.2 F (36.8 C) (Oral)   Resp 18  Ht 5\' 4"  (1.626 m)   Wt 160 lb (72.6 kg)   LMP 12/05/2017   SpO2 100%   BMI 27.46 kg/m   Visual Acuity Right Eye Distance:   Left Eye Distance:   Bilateral Distance:    Right Eye Near:   Left Eye Near:    Bilateral Near:     Physical Exam Vitals signs and nursing note reviewed.  Constitutional:      General: She is not in acute distress.    Appearance: Normal appearance. She is not ill-appearing, toxic-appearing or diaphoretic.  HENT:     Head: Normocephalic.     Right Ear: There is impacted cerumen.     Left Ear: There is impacted cerumen.     Nose: Nose normal.     Mouth/Throat:     Mouth: Mucous membranes are moist.     Pharynx: No oropharyngeal exudate or posterior oropharyngeal erythema.  Eyes:     Extraocular Movements: Extraocular movements intact.     Pupils: Pupils are equal, round, and reactive to light.  Musculoskeletal: Normal range of motion.  Skin:    General: Skin is warm and dry.  Neurological:     General: No focal deficit present.     Mental Status: She is alert and oriented to person, place, and time.  Psychiatric:        Mood and Affect: Mood normal.        Behavior: Behavior normal.        Thought Content: Thought content normal.        Judgment: Judgment normal.      UC Treatments /  Results  Labs (all labs ordered are listed, but only abnormal results are displayed) Labs Reviewed - No data to display  EKG None  Radiology No results found.  Procedures Required curettage and irrigation to remove a large amount of impacted cerumen.  Performed in both ears.  Following the lavage and curettage examination showed the right ear to be completely clear of cerumen.  The left ear showed a small adherent wax to the superior portion of the canal not amenable to curettage.  I have told the patient that she should apply mineral oil to a cotton ball in place in her ear for 10 minutes once weekly to see if this will help dissolve the wax.  Has any problems she should follow-up with [facility].  Medications Ordered in UC Medications - No data to display  Initial Impression / Assessment and Plan / UC Course  I have reviewed the triage vital signs and the nursing notes.  Pertinent labs & imaging results that were available during my care of the patient were reviewed by me and considered in my medical decision making (see chart for details).     I have told the patient that she should apply mineral oil to a cotton ball in place in her ear for 10 minutes once weekly to see if this will help dissolve the wax.  Has any problems she should follow-up with [facility].    Final Clinical Impressions(s) / UC Diagnoses   Final diagnoses:  Bilateral impacted cerumen   Discharge Instructions   None    ED Prescriptions    None     Controlled Substance Prescriptions [place] Controlled Substance Registry consulted? Not Applicable   Lutricia FeilRoemer, Dazhane Villagomez P, PA-C 12/30/17 1805

## 2018-02-04 DIAGNOSIS — F410 Panic disorder [episodic paroxysmal anxiety] without agoraphobia: Secondary | ICD-10-CM | POA: Diagnosis not present

## 2018-02-04 DIAGNOSIS — F3342 Major depressive disorder, recurrent, in full remission: Secondary | ICD-10-CM | POA: Diagnosis not present

## 2018-05-14 ENCOUNTER — Other Ambulatory Visit: Payer: Self-pay

## 2018-05-14 ENCOUNTER — Ambulatory Visit
Admission: EM | Admit: 2018-05-14 | Discharge: 2018-05-14 | Disposition: A | Discharge status: Home / Self Care | Payer: Federal, State, Local not specified - PPO | Attending: Family Medicine | Admitting: Family Medicine

## 2018-05-14 ENCOUNTER — Encounter: Payer: Self-pay | Admitting: Emergency Medicine

## 2018-05-14 DIAGNOSIS — R05 Cough: Secondary | ICD-10-CM | POA: Diagnosis not present

## 2018-05-14 DIAGNOSIS — R509 Fever, unspecified: Secondary | ICD-10-CM | POA: Diagnosis not present

## 2018-05-14 DIAGNOSIS — J111 Influenza due to unidentified influenza virus with other respiratory manifestations: Secondary | ICD-10-CM

## 2018-05-14 DIAGNOSIS — R69 Illness, unspecified: Principal | ICD-10-CM

## 2018-05-14 MED ORDER — OSELTAMIVIR PHOSPHATE 75 MG PO CAPS: 75.0000 mg | ORAL_CAPSULE | Freq: Two times a day (BID) | ORAL | 0 refills | Status: AC

## 2018-05-14 MED ORDER — NAPROXEN 500 MG PO TABS: 500.0000 mg | ORAL_TABLET | Freq: Two times a day (BID) | ORAL | 0 refills | Status: AC | PRN

## 2018-05-14 NOTE — Discharge Instructions (Signed)
Rest. Fluids.  Medication as directed.  If you fail to improve or worsen, please go to the ER.  Dr. Adriana Simas

## 2018-05-14 NOTE — ED Triage Notes (Signed)
Patient c/o generalized body aches that started yesterday. Patient states she also had a fever of 101.6 that started at 4am this morning. Patient reports mild cough.

## 2018-05-14 NOTE — ED Provider Notes (Signed)
[facility]    CSN: [id number] Arrival date & time: 05/14/18  1018  History   Chief Complaint Chief Complaint  Patient presents with  . Generalized Body Aches  . Fever    HPI  43 year old female presents with the above complaints.  Patient reports her symptoms started yesterday.  She reports generalized body aches, and fever (T-max 101.6).  She also reports mild cough.  Patient took medication for fever this morning.  Currently has temperature 99.5.  No reported sick contacts.  No recent travel.  No known exacerbating factors.  No other reported symptoms.  No other complaints.  PMH, Surgical Hx, Family Hx, Social History reviewed and updated as below.  PMH: Depression, Anxiety  Surgical Hx: Past Surgical History:  Procedure Laterality Date  . TUBAL LIGATION  2013    OB History    Gravida  2   Para      Term      Preterm      AB      Living  2     SAB      TAB      Ectopic      Multiple      Live Births  2            Home Medications    Prior to Admission medications   Medication Sig Start Date End Date Taking? Authorizing Provider  clonazePAM (KLONOPIN) 1 MG tablet Take 1 mg by mouth 2 (two) times daily.   Yes [provider]  Vilazodone HCl (VIIBRYD) 40 MG TABS  10/22/17  Yes [provider]  naproxen (NAPROSYN) 500 MG tablet Take 1 tablet (500 mg total) by mouth 2 (two) times daily as needed for moderate pain or headache. 05/14/18   Tommie Samsook, Maxyne Derocher G, DO  oseltamivir (TAMIFLU) 75 MG capsule Take 1 capsule (75 mg total) by mouth every 12 (twelve) hours. Diagnosis: Influenza like illness 05/14/18   Tommie Samsook, Vadhir Mcnay G, DO    Family History Family History  Problem Relation Age of Onset  . Cancer Maternal Grandfather     Social History Social History   Tobacco Use  . Smoking status: Former Games developermoker  . Smokeless tobacco: Never Used  Substance Use Topics  . Alcohol use: Yes    Comment: occasional  . Drug use: No      Allergies   Patient has no known allergies.   Review of Systems Review of Systems  Constitutional: Positive for fever.  Respiratory: Positive for cough.   Musculoskeletal:       Body aches.   Physical Exam Triage Vital Signs ED Triage Vitals  Enc Vitals Group     BP 05/14/18 1038 (!) 146/99     Pulse Rate 05/14/18 1038 (!) 117     Resp 05/14/18 1038 18     Temp 05/14/18 1038 99.5 F (37.5 C)     Temp Source 05/14/18 1038 Oral     SpO2 05/14/18 1038 100 %     Weight 05/14/18 1040 175 lb (79.4 kg)     Height 05/14/18 1040 5\' 4"  (1.626 m)     Head Circumference --      Peak Flow --      Pain Score 05/14/18 1038 5     Pain Loc --      Pain Edu? --      Excl. in GC? --    Updated Vital Signs BP (!) 146/99 (BP Location: Right Arm)   Pulse (!) 117  Temp 99.5 F (37.5 C) (Oral)   Resp 18   Ht 5\' 4"  (1.626 m)   Wt 79.4 kg   LMP 05/07/2018   SpO2 100%   BMI 30.04 kg/m   Visual Acuity Right Eye Distance:   Left Eye Distance:   Bilateral Distance:    Right Eye Near:   Left Eye Near:    Bilateral Near:     Physical Exam Constitutional:      General: She is not in acute distress.    Appearance: Normal appearance.  HENT:     Head: Normocephalic and atraumatic.     Mouth/Throat:     Pharynx: Oropharynx is clear. No oropharyngeal exudate.  Eyes:     General:        Right eye: No discharge.        Left eye: No discharge.     Conjunctiva/sclera: Conjunctivae normal.  Cardiovascular:     Rate and Rhythm: Regular rhythm. Tachycardia present.  Pulmonary:     Effort: Pulmonary effort is normal. No respiratory distress.     Breath sounds: Normal breath sounds.  Neurological:     Mental Status: She is alert.  Psychiatric:        Mood and Affect: Mood normal.        Behavior: Behavior normal.    UC Treatments / Results  Labs (all labs ordered are listed, but only abnormal results are displayed) Labs Reviewed - No data to display  EKG None  Radiology No  results found.  Procedures Procedures (including critical care time)  Medications Ordered in UC Medications - No data to display  Initial Impression / Assessment and Plan / UC Course  I have reviewed the triage vital signs and the nursing notes.  Pertinent labs & imaging results that were available during my care of the patient were reviewed by me and considered in my medical decision making (see chart for details).    43 year old female presents with an influenza-like illness.  Clinically I do not suspect COVID-19.  Treating empirically with Tamiflu.  Work note given.  Advised her to go to the hospital if she fails to improve or worsens.  Final Clinical Impressions(s) / UC Diagnoses   Final diagnoses:  Influenza-like illness     Discharge Instructions     Rest. Fluids.  Medication as directed.  If you fail to improve or worsen, please go to the ER.  Dr. Adriana Simas    ED Prescriptions    Medication Sig Dispense Auth. Provider   oseltamivir (TAMIFLU) 75 MG capsule Take 1 capsule (75 mg total) by mouth every 12 (twelve) hours. Diagnosis: Influenza like illness 10 capsule Tanice Petre G, DO   naproxen (NAPROSYN) 500 MG tablet Take 1 tablet (500 mg total) by mouth 2 (two) times daily as needed for moderate pain or headache. 30 tablet Tommie Sams, DO     Controlled Substance Prescriptions [place] Controlled Substance Registry consulted? Not Applicable   Tommie Sams, DO 05/14/18 1550

## 2018-06-29 DIAGNOSIS — K080 Exfoliation of teeth due to systemic causes: Secondary | ICD-10-CM | POA: Diagnosis not present

## 2018-07-29 DIAGNOSIS — F410 Panic disorder [episodic paroxysmal anxiety] without agoraphobia: Secondary | ICD-10-CM | POA: Diagnosis not present

## 2018-07-29 DIAGNOSIS — F3342 Major depressive disorder, recurrent, in full remission: Secondary | ICD-10-CM | POA: Diagnosis not present

## 2018-08-11 ENCOUNTER — Other Ambulatory Visit: Payer: Self-pay

## 2018-08-11 DIAGNOSIS — Z20822 Contact with and (suspected) exposure to covid-19: Secondary | ICD-10-CM

## 2018-08-13 LAB — NOVEL CORONAVIRUS, NAA: SARS-CoV-2, NAA: NOT DETECTED
